# Patient Record
Sex: Male | Born: 1985 | Race: Black or African American | Hispanic: No | Marital: Single | Smoking: Current every day smoker
Health system: Southern US, Community
[De-identification: ages and names within clinical notes are randomized; demographics above are authoritative.]

## PROBLEM LIST (undated history)

## (undated) DIAGNOSIS — Z9109 Other allergy status, other than to drugs and biological substances: Secondary | ICD-10-CM

---
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
## 2006-08-05 ENCOUNTER — Emergency Department (HOSPITAL_COMMUNITY): Admission: EM | Admit: 2006-08-05 | Discharge: 2006-08-05 | Payer: Self-pay | Admitting: Emergency Medicine

## 2006-11-26 ENCOUNTER — Emergency Department (HOSPITAL_COMMUNITY): Admission: EM | Admit: 2006-11-26 | Discharge: 2006-11-26 | Payer: Self-pay | Admitting: Emergency Medicine

## 2006-12-01 ENCOUNTER — Emergency Department (HOSPITAL_COMMUNITY): Admission: EM | Admit: 2006-12-01 | Discharge: 2006-12-01 | Payer: Self-pay | Admitting: Emergency Medicine

## 2009-01-25 ENCOUNTER — Emergency Department (HOSPITAL_COMMUNITY): Admission: EM | Admit: 2009-01-25 | Discharge: 2009-01-25 | Payer: Self-pay | Admitting: Emergency Medicine

## 2009-03-13 ENCOUNTER — Emergency Department (HOSPITAL_COMMUNITY): Admission: EM | Admit: 2009-03-13 | Discharge: 2009-03-13 | Payer: Self-pay | Admitting: Emergency Medicine

## 2009-05-16 ENCOUNTER — Emergency Department (HOSPITAL_COMMUNITY): Admission: EM | Admit: 2009-05-16 | Discharge: 2009-05-16 | Payer: Self-pay | Admitting: Emergency Medicine

## 2013-08-06 ENCOUNTER — Emergency Department (INDEPENDENT_AMBULATORY_CARE_PROVIDER_SITE_OTHER)
Admission: EM | Admit: 2013-08-06 | Discharge: 2013-08-06 | Disposition: A | Discharge status: Home / Self Care | Payer: Self-pay | Source: Home / Self Care | Attending: Family Medicine | Admitting: Family Medicine

## 2013-08-06 ENCOUNTER — Encounter (HOSPITAL_COMMUNITY): Payer: Self-pay | Admitting: *Deleted

## 2013-08-06 DIAGNOSIS — L272 Dermatitis due to ingested food: Secondary | ICD-10-CM

## 2013-08-06 MED ORDER — TRIAMCINOLONE ACETONIDE 40 MG/ML IJ SUSP
INTRAMUSCULAR | Status: AC
  Filled 2013-08-06: qty 1

## 2013-08-06 MED ORDER — METHYLPREDNISOLONE ACETATE 40 MG/ML IJ SUSP
80.0000 mg | Freq: Once | INTRAMUSCULAR | Status: AC
  Administered 2013-08-06: 80 mg via INTRAMUSCULAR

## 2013-08-06 MED ORDER — METHYLPREDNISOLONE ACETATE 80 MG/ML IJ SUSP
INTRAMUSCULAR | Status: AC
  Filled 2013-08-06: qty 1

## 2013-08-06 MED ORDER — TRIAMCINOLONE ACETONIDE 40 MG/ML IJ SUSP
40.0000 mg | Freq: Once | INTRAMUSCULAR | Status: AC
  Administered 2013-08-06: 40 mg via INTRAMUSCULAR

## 2013-08-06 MED ORDER — HYDROXYZINE HCL 25 MG PO TABS: 25.0000 mg | ORAL_TABLET | Freq: Four times a day (QID) | ORAL | Status: DC

## 2013-08-06 NOTE — ED Provider Notes (Signed)
  CSN: [id number]     Arrival date & time 08/06/13  1137 History     First MD Initiated Contact with Patient 08/06/13 1211     Chief Complaint  Patient presents with  . Allergic Reaction   (Consider location/radiation/quality/duration/timing/severity/associated sxs/prior Treatment) Patient is a 27 y.o. male presenting with allergic reaction. The history is provided by the patient.  Allergic Reaction Presenting symptoms: itching and rash   Presenting symptoms: no difficulty breathing, no difficulty swallowing, no swelling and no wheezing   Severity:  Mild Prior allergic episodes:  Food/nut allergies Context: dairy/milk products and food   Context comment:  Onset sev hrs after eating [employer] pizza 2 d ago. Relieved by:  None tried Worsened by:  Nothing tried Ineffective treatments:  None tried   History reviewed. No pertinent past medical history. History reviewed. No pertinent past surgical history. No family history on file. History  Substance Use Topics  . Smoking status: Current Every Day Smoker  . Smokeless tobacco: Not on file  . Alcohol Use: Yes    Review of Systems  Constitutional: Negative.   HENT: Negative for trouble swallowing.   Respiratory: Negative for wheezing.   Cardiovascular: Negative for chest pain.  Skin: Positive for itching and rash.    Allergies  Review of patient's allergies indicates no known allergies.  Home Medications   Current Outpatient Rx  Name  Route  Sig  Dispense  Refill  . hydrOXYzine (ATARAX/VISTARIL) 25 MG tablet   Oral   Take 1 tablet (25 mg total) by mouth every 6 (six) hours. For itching   20 tablet   0    BP 136/89  Pulse 92  Temp(Src) 98.4 F (36.9 C) (Oral)  Resp 20  SpO2 99% Physical Exam  Nursing note and vitals reviewed. Constitutional: He is oriented to person, place, and time. He appears well-developed and well-nourished. No distress.  HENT:  Mouth/Throat: Oropharynx is clear and moist and mucous membranes  are normal. Edematous present.  Neck: Normal range of motion. Neck supple.  Cardiovascular: Normal rate, normal heart sounds and intact distal pulses.   Pulmonary/Chest: Effort normal and breath sounds normal.  Lymphadenopathy:    He has no cervical adenopathy.  Neurological: He is alert and oriented to person, place, and time.  Skin: Skin is warm and dry. Rash noted.  Fine diffuse papular rash over entire body.    ED Course   Procedures (including critical care time)  Labs Reviewed - No data to display No results found. 1. Food allergic skin reaction     MDM    Linna Hoff, MD 08/06/13 1251

## 2013-08-06 NOTE — ED Notes (Signed)
Pt   Reports       Symptoms  Of           Rash  Swelling  As  Well  As   Some tightness  In  Chest   After  Eating  Pizza     A  Few  Days  Ago   He  Is  Sitting  Upright on  Exam table  Speaking in  Complete  sentances  And   Is  In no  Severe  Distress

## 2014-03-17 ENCOUNTER — Encounter (HOSPITAL_COMMUNITY): Payer: Self-pay | Admitting: Emergency Medicine

## 2014-03-17 ENCOUNTER — Emergency Department (HOSPITAL_COMMUNITY)
Admission: EM | Admit: 2014-03-17 | Discharge: 2014-03-17 | Disposition: A | Discharge status: Home / Self Care | Payer: Self-pay | Attending: Emergency Medicine | Admitting: Emergency Medicine

## 2014-03-17 ENCOUNTER — Other Ambulatory Visit: Payer: Self-pay

## 2014-03-17 ENCOUNTER — Emergency Department (HOSPITAL_COMMUNITY): Payer: Self-pay

## 2014-03-17 DIAGNOSIS — R0789 Other chest pain: Secondary | ICD-10-CM | POA: Insufficient documentation

## 2014-03-17 DIAGNOSIS — R630 Anorexia: Secondary | ICD-10-CM | POA: Insufficient documentation

## 2014-03-17 DIAGNOSIS — R000 Tachycardia, unspecified: Secondary | ICD-10-CM | POA: Insufficient documentation

## 2014-03-17 DIAGNOSIS — R1013 Epigastric pain: Secondary | ICD-10-CM | POA: Insufficient documentation

## 2014-03-17 DIAGNOSIS — R079 Chest pain, unspecified: Secondary | ICD-10-CM

## 2014-03-17 DIAGNOSIS — R110 Nausea: Secondary | ICD-10-CM | POA: Insufficient documentation

## 2014-03-17 DIAGNOSIS — F17200 Nicotine dependence, unspecified, uncomplicated: Secondary | ICD-10-CM | POA: Insufficient documentation

## 2014-03-17 DIAGNOSIS — R030 Elevated blood-pressure reading, without diagnosis of hypertension: Secondary | ICD-10-CM | POA: Insufficient documentation

## 2014-03-17 DIAGNOSIS — I10 Essential (primary) hypertension: Secondary | ICD-10-CM

## 2014-03-17 LAB — HEPATIC FUNCTION PANEL
ALBUMIN: 4.2 g/dL (ref 3.5–5.2)
ALT: 27 U/L (ref 0–53)
AST: 31 U/L (ref 0–37)
Alkaline Phosphatase: 55 U/L (ref 39–117)
BILIRUBIN TOTAL: 0.5 mg/dL (ref 0.3–1.2)
Bilirubin, Direct: <0.2 mg/dL (ref 0.0–0.3)
Total Protein: 7.6 g/dL (ref 6.0–8.3)

## 2014-03-17 LAB — LIPASE, BLOOD: LIPASE: 28 U/L (ref 11–59)

## 2014-03-17 LAB — CBC
HCT: 47.4 % (ref 39.0–52.0)
Hemoglobin: 17.5 g/dL — ABNORMAL HIGH (ref 13.0–17.0)
MCH: 32.8 pg (ref 26.0–34.0)
MCHC: 36.9 g/dL — ABNORMAL HIGH (ref 30.0–36.0)
MCV: 88.8 fL (ref 78.0–100.0)
PLATELETS: 267 10*3/uL (ref 150–400)
RBC: 5.34 MIL/uL (ref 4.22–5.81)
RDW: 13.1 % (ref 11.5–15.5)
WBC: 8.1 10*3/uL (ref 4.0–10.5)

## 2014-03-17 LAB — I-STAT TROPONIN, ED
TROPONIN I, POC: 0 ng/mL (ref 0.00–0.08)
TROPONIN I, POC: 0 ng/mL (ref 0.00–0.08)

## 2014-03-17 LAB — BASIC METABOLIC PANEL
BUN: 3 mg/dL — ABNORMAL LOW (ref 6–23)
CHLORIDE: 97 meq/L (ref 96–112)
CO2: 27 mEq/L (ref 19–32)
CREATININE: 0.81 mg/dL (ref 0.50–1.35)
Calcium: 9.4 mg/dL (ref 8.4–10.5)
GFR calc non Af Amer: >90 mL/min (ref >90)
Glucose, Bld: 110 mg/dL — ABNORMAL HIGH (ref 70–99)
POTASSIUM: 3.1 meq/L — AB (ref 3.7–5.3)
Sodium: 142 mEq/L (ref 137–147)

## 2014-03-17 LAB — MAGNESIUM: Magnesium: 1.8 mg/dL (ref 1.5–2.5)

## 2014-03-17 MED ORDER — POTASSIUM CHLORIDE CRYS ER 20 MEQ PO TBCR
40.0000 meq | EXTENDED_RELEASE_TABLET | Freq: Once | ORAL | Status: AC
  Administered 2014-03-17: 40 meq via ORAL
  Filled 2014-03-17: qty 2

## 2014-03-17 MED ORDER — GI COCKTAIL ~~LOC~~
30.0000 mL | Freq: Once | ORAL | Status: AC
  Administered 2014-03-17: 30 mL via ORAL
  Filled 2014-03-17: qty 30

## 2014-03-17 MED ORDER — SODIUM CHLORIDE 0.9 % IV BOLUS (SEPSIS)
1000.0000 mL | Freq: Once | INTRAVENOUS | Status: AC
  Administered 2014-03-17: 1000 mL via INTRAVENOUS

## 2014-03-17 MED ORDER — FAMOTIDINE 20 MG PO TABS
20.0000 mg | ORAL_TABLET | Freq: Once | ORAL | Status: AC
  Administered 2014-03-17: 20 mg via ORAL
  Filled 2014-03-17: qty 1

## 2014-03-17 MED ORDER — HYDROCODONE-ACETAMINOPHEN 5-325 MG PO TABS
2.0000 | ORAL_TABLET | Freq: Once | ORAL | Status: AC
  Administered 2014-03-17: 2 via ORAL
  Filled 2014-03-17: qty 2

## 2014-03-17 NOTE — ED Notes (Signed)
Zavitz MD at bedside. 

## 2014-03-17 NOTE — ED Provider Notes (Signed)
CSN: [id number]     Arrival date & time 03/17/14  0055 History   First MD Initiated Contact with Patient 03/17/14 0107     Chief Complaint  Patient presents with  . Chest Pain     (Consider location/radiation/quality/duration/timing/severity/associated sxs/prior Treatment) HPI Comments: 28 year old male with smoking history, no significant medical history, no family history of cardiac, social alcohol user presents with epigastric pain in the left upper chest pain. Patient has had constant left upper chest pain worse with movement and coughing and breathing for the past 3 days. No history of similar, no injuries. Patient denies recent surgery, blood clot history, leg pain/swelling, long travel, active cancer. No cardiac history known. Pain is constant ache.  No diaphoresis or exertional symptoms. Epigastric pain has been similar time., Mild worsening with palpation in evening. No known gallbladder problems. No blood in the stools, NSAID use or known ulcer/scope history  Patient is a 28 y.o. male presenting with chest pain. The history is provided by the patient.  Chest Pain Associated symptoms: nausea   Associated symptoms: no abdominal pain, no back pain, no cough, no fever, no headache, no shortness of breath and not vomiting     History reviewed. No pertinent past medical history. History reviewed. No pertinent past surgical history. History reviewed. No pertinent family history. History  Substance Use Topics  . Smoking status: Current Every Day Smoker  . Smokeless tobacco: Not on file  . Alcohol Use: Yes    Review of Systems  Constitutional: Positive for appetite change. Negative for fever and chills.  HENT: Negative for congestion.   Eyes: Negative for visual disturbance.  Respiratory: Negative for cough and shortness of breath.   Cardiovascular: Positive for chest pain.  Gastrointestinal: Positive for nausea. Negative for vomiting and abdominal pain.  Genitourinary: Negative  for dysuria and flank pain.  Musculoskeletal: Negative for back pain, neck pain and neck stiffness.  Skin: Negative for rash.  Neurological: Negative for light-headedness and headaches.      Allergies  Review of patient's allergies indicates no known allergies.  Home Medications  No current outpatient prescriptions on file. BP 162/120  Pulse 110  Temp(Src) 98.9 F (37.2 C) (Oral)  Resp 24  Wt 195 lb (88.451 kg)  SpO2 99% Physical Exam  Nursing note and vitals reviewed. Constitutional: He is oriented to person, place, and time. He appears well-developed and well-nourished.  HENT:  Head: Normocephalic and atraumatic.  Eyes: Conjunctivae are normal. Right eye exhibits no discharge. Left eye exhibits no discharge.  Neck: Normal range of motion. Neck supple. No tracheal deviation present.  Cardiovascular: Regular rhythm.  Tachycardia present.   Pulmonary/Chest: Effort normal and breath sounds normal.  Abdominal: Soft. He exhibits no distension. There is tenderness (mild tenderness). There is no guarding.  Musculoskeletal: He exhibits tenderness (moderate tender left upper rib). He exhibits no edema.  Neurological: He is alert and oriented to person, place, and time.  Skin: Skin is warm. No rash noted.  Psychiatric: He has a normal mood and affect.    ED Course  Procedures (including critical care time) Labs Review Labs Reviewed  CBC - Abnormal; Notable for the following:    Hemoglobin 17.5 (*)    MCHC 36.9 (*)    All other components within normal limits  BASIC METABOLIC PANEL - Abnormal; Notable for the following:    Potassium 3.1 (*)    Glucose, Bld 110 (*)    BUN 3 (*)    All other components within normal  limits  HEPATIC FUNCTION PANEL  LIPASE, BLOOD  MAGNESIUM  I-STAT TROPOININ, ED  I-STAT TROPOININ, ED   Imaging Review Dg Chest 2 View  03/17/2014   CLINICAL DATA:  7 hr of mid chest pain.  EXAM: CHEST  2 VIEW  COMPARISON:  DG CHEST 1 VIEW dated 06/24/2005   FINDINGS: Cardiomediastinal silhouette is unremarkable. The lungs are clear without pleural effusions or focal consolidations. Trachea projects midline and there is no pneumothorax. Soft tissue planes and included osseous structures are non-suspicious.  IMPRESSION: No acute cardiopulmonary process.   Electronically Signed   By: Awilda Metroourtnay  Bloomer   On: 03/17/2014 01:30     EKG Interpretation   Date/Time:  Monday March 17 2014 00:59:35 EDT Ventricular Rate:  104 PR Interval:  152 QRS Duration: 90 QT Interval:  350 QTC Calculation: 460 R Axis:   39 Text Interpretation:  Sinus tachycardia Nonspecific ST and T wave  abnormality Abnormal ECG Confirmed by Vanessa Kampf  MD, Jamaris Theard ([id number]) on  03/17/2014 1:48:01 AM      MDM   Final diagnoses:  Chest pain  Epigastric abdominal pain  High blood pressure   Atypical chest pain with only smoking as risk factor. Pain is reproducible and with concurrent epigastric pain concern more likely for musculoskeletal/gastric ulcer/GERD/other. Plans screening cardiac workup, GI cocktail, pain medicines. Chest x-ray ordered EKG reviewed no acute findings chest x-ray reviewed no acute findings  Patient symptoms improve significantly GI cocktail. Vitals improved in the ER. Strongly suggested patient follow close with primary car care doctor for blood pressure monitoring and if chest pain recurs for stress test. Felt to troponin negative and ED. No concern for PE at this time.  Results and differential diagnosis were discussed with the patient. Close follow up outpatient was discussed, patient comfortable with the plan.   Filed Vitals:   03/17/14 0300 03/17/14 0315 03/17/14 0330 03/17/14 0445  BP: 153/108 150/105 152/99 151/98  Pulse:  122 79 67  Temp:      TempSrc:      Resp:      Weight:      SpO2:  91% 99% 98%       Enid SkeensJoshua M Ayslin Kundert, MD 03/17/14 [phone]

## 2014-03-17 NOTE — Discharge Instructions (Signed)
Take prilosec for 2 weeks. Obtain a primary doctor. See your doctor for blood pressure recheck and adjustments to decrease risk of stroke or heart attack in the future. If you were given medicines take as directed.  If you are on coumadin or contraceptives realize their levels and effectiveness is altered by many different medicines.  If you have any reaction (rash, tongues swelling, other) to the medicines stop taking and see a physician.   Please follow up as directed and return to the ER or see a physician for new or worsening symptoms.  Thank you. Filed Vitals:   03/17/14 0245 03/17/14 0300 03/17/14 0315 03/17/14 0330  BP: 151/102 153/108 150/105 152/99  Pulse:   122 79  Temp:      TempSrc:      Resp:      Weight:      SpO2:   91% 99%    Emergency Department Resource Guide 1) Find a Doctor and Pay Out of Pocket Although you won't have to find out who is covered by your insurance plan, it is a good idea to ask around and get recommendations. You will then need to call the office and see if the doctor you have chosen will accept you as a new patient and what types of options they offer for patients who are self-pay. Some doctors offer discounts or will set up payment plans for their patients who do not have insurance, but you will need to ask so you aren't surprised when you get to your appointment.  2) Contact Your Local Health Department Not all health departments have doctors that can see patients for sick visits, but many do, so it is worth a call to see if yours does. If you don't know where your local health department is, you can check in your phone book. The CDC also has a tool to help you locate your state's health department, and many state websites also have listings of all of their local health departments.  3) Find a Walk-in Clinic If your illness is not likely to be very severe or complicated, you may want to try a walk in clinic. These are popping up all over the country in  pharmacies, drugstores, and shopping centers. They're usually staffed by nurse practitioners or physician assistants that have been trained to treat common illnesses and complaints. They're usually fairly quick and inexpensive. However, if you have serious medical issues or chronic medical problems, these are probably not your best option.  No Primary Care Doctor: - Call Health Connect at  [phone] - they can help you locate a primary care doctor that  accepts your insurance, provides certain services, etc. - Physician Referral Service- [phone]  Chronic Pain Problems: Organization         Address  Phone   Notes  [facility]  [phone] Patients need to be referred by their primary care doctor.   Medication Assistance: Organization         Address  Phone   Notes  [facility] Medication [facility] [address] [place] [place], [place] [address] [phone] --Must be a resident of [facility] -- Must have NO insurance coverage whatsoever (no Medicaid/ Medicare, etc.) -- The pt. MUST have a primary care doctor that directs their care regularly and follows them in the community   Med[facility]ist  [phone]   [employer]  [phone]    Agencies that provide inexpensive medical care: Organization  Address  Phone   Notes  [facility]  [phone]   [facility]    [phone]   [facility] [address] [place], [place] [address] [phone]   [facility] [place] [address] [place] [place] [phone]   Planned Parenthood    [phone]   [facility]    [phone]   [facility]  [address], [place] Phone:  [phone], Fax:  [phone] Hours of Operation:  9 am - 6 pm, M-F.  Also accepts Medicaid/Medicare and self-pay.  [facility]  [address],  [place] Phone: [phone], Fax: [phone]. Hours of Operation:  8:30 am - 5:30 pm, M-F.  Also accepts Medicaid and self-pay.  [facility] [place] [address], [place] Phone: [phone]   [facility] [address] Natasha Bence [place], [place] [phone], Ext. [phone] Mondays & Thursdays: 7-9 AM.  First 15 patients are seen on a first come, first serve basis.    Medicaid-accepting [facility] Providers:  Organization         Address  Phone   Notes  [facility] [address], [place] [phone] Also accepts self-pay patients.  [facility] [address] Laurell Josephs [place], [place]  [phone]   [facility] [address], [place] [phone]   [facility] [address] [place] [phone]   Renaye Rakers [address], [place]   [phone] Only accepts [place] Access [place] patients after they have their name applied to their card.   Self-Pay (no insurance) in [facility]:  Organization         Address  Phone   Notes  Sickle Cell Patients, [facility] [address] [place], [place] [phone]   [facility] [address] [place], [place] [phone]   [facility] [place]  [address], [place] [phone]   [facility]/Dr. Osei-Bonsu  [address] [place] Sanchez or [address], [place] [phone] Phone number for both [place] and [place] locations is the same.  [facility] [address] [place] [phone]   [facility] [address], [place] or [address] [phone]   [facility] [address] [place] [phone], phone; [phone], fax Sees patients 1st and 3rd Saturday of every month.  Must not  qualify for public or private insurance (i.e. Medicaid, Medicare, [place] Health Choice, Veterans' Benefits)  Household income should be no more than 200% of the poverty level The clinic cannot treat you if you are pregnant or think you are pregnant  Sexually transmitted diseases are not treated at the clinic.    Dental Care: Organization         Address  Phone  Notes  [facility] Department of [facility] [address] [place], [place] [phone] Accepts children up to age 41 who are enrolled in [place] or [place] Health Choice; pregnant women with a Medicaid card; and children who have applied for Medicaid or [place] Health Choice, but were declined, whose parents can pay a reduced fee at time  of service.  [facility] [place]  [address], [place] [phone] Accepts children up to age 27 who are enrolled in [place] or [place] Health Choice; pregnant women with a Medicaid card; and children who have applied for Medicaid or  Health Choice, but were declined, whose parents can pay a reduced fee at time of service.  [facility]  [address], [place] [phone] Patients are seen by appointment only. Walk-ins are not accepted. [facility] will see patients 27 years of age and older. Monday - Tuesday (8am-5pm) Most Wednesdays (8:30-5pm) $30 per visit, cash only  [facility]  [address], [facility] [phone] Patients are seen by appointment only. Walk-ins are not accepted. [facility] will see patients 27 years of age and older. One Wednesday Evening (Monthly: Volunteer Based).  $30 per visit, cash only  [employer]  [phone] for adults; Children under age 84, call Graduate Pediatric Dentistry at [phone]. Children aged 224-14, please call [phone] to request a pediatric application.  Dental services are provided  in all areas of dental care including fillings, crowns and bridges, complete and partial dentures, implants, gum treatment, root canals, and extractions. Preventive care is also provided. Treatment is provided to both adults and children. Patients are selected via a lottery and there is often a waiting list.   [facility] [address], [place]  [phone] [email or web address]   [facility] [address], [place], [place] [phone], Ext. [phone] Second and Fourth Thursday of each month, opens at 6:30 AM; Clinic ends at 9 AM.  Patients are seen on a first-come first-served basis, and a limited number are seen during each clinic.   [facility]  [address], [place], [place] [phone]   Eligibility Requirements You must have lived in [place], [place], or [place] counties for at least the last three months.   You cannot be eligible for state or federal sponsored [place] Cityhealthcare insurance, including CIGNAVeterans Administration, [place]icaid, or [employer]icare.   You generally cannot be eligible for healthcare insurance through your employer.    How to apply: Eligibility screenings are held every Tuesday and Wednesday afternoon from 1:00 pm until 4:00 pm. You do not need an appointment for the interview!  [facility] [address], [place], [place] [phone]   [facility]  [phone]   [facility]  [phone]   [facility]  [phone]    Behavioral Health Resources in the Community: Intensive Outpatient Programs Organization         Address  Phone  Notes  [facility] [address], [place], [place] [phone]   [facility] [address], [place], [place] [phone]   [facility] [address], [place], [place]  [phone]   [facility] [address],  [place], [place]  [phone] or [phone]   Substance Abuse Resources Organization         Address  Phone  Notes  [facility]  [phone]   [facility]  [phone]   [facility]  [phone]   [facility]  [phone]   Residential & Outpatient Substance Abuse Program  [phone]   Psychological Services Organization         Address  Phone  Notes  [facility]  [place]  [phone]   [place] [address] [place] or [phone]    Mobile Crisis Teams Organization         Address  Phone  Notes  [facility], Mobile Crisis Care Unit  [phone]   [facility]  [address] [place], [place]   [place] [address], [place] [phone]    Self-Help/Support Groups Organization         Address  Phone             Notes  [place] of [place] - variety of support groups  [place] Call for more information  Narcotics Anonymous (NA), [facility] [address], [employer] [place]  2 meetings at this location   Special educational needs teacher         Address  Phone  Notes  ASAP Residential Treatment [place],    [place]  [phone]   [facility]  [address] [place] [phone], [place], [place]   [place], [place] [phone] Admissions: 8am-3pm M-F  Incentives Substance [place] [address]    [place], [place] [phone]   [facility] [address] [place], [place], [place]   [facility] [address],  [place], [place]   [facility] - Intensive Outpatient [address] Kristeen [address], [place], [place]   [facility] ([place] [address]  [place], [place] [phone] or  [phone]   [facility] ([facility]) [address], [place], [place] Accepts Medicaid  [facility] [place] [address]  [place] [phone] Substance Abuse/Addiction Treatment   [facility] Organization         Address  Phone  Notes  [facility]  [phone]   Domenic Schwab, PhD [address] Arlis [place], [place]   [phone] or [phone]   [facility] [place] [address] [place], [place] [phone]   [facility] [address] [place], [place] [phone] Insurance/Medicaid/sponsorship through [facility] and Families [address], [address] [place]                                    [place], [place] [phone] [facility] [address], [place] [phone]    Dr. Adele Schilder  [phone]   [facility] Dept. 1) [address] [place] 2) [place] 3)  [address], [place] [phone]  [phone]   [place] [phone] or [phone] (After Hours)

## 2014-03-17 NOTE — ED Notes (Addendum)
Present with left sided chest pain began at 11 pm this evening while driving pain is described as tender,  Nothing makes pain better, deep breathing and touch makes pain worse. Pain does not radiate. Pain is associated with nausea. Denies dizziness and lightheadedness. BP 162/120 -HR 110

## 2016-04-19 ENCOUNTER — Emergency Department (HOSPITAL_COMMUNITY)
Admission: EM | Admit: 2016-04-19 | Discharge: 2016-04-19 | Disposition: A | Discharge status: Home / Self Care | Payer: Self-pay | Attending: Emergency Medicine | Admitting: Emergency Medicine

## 2016-04-19 ENCOUNTER — Encounter (HOSPITAL_COMMUNITY): Payer: Self-pay | Admitting: *Deleted

## 2016-04-19 ENCOUNTER — Emergency Department (HOSPITAL_COMMUNITY): Payer: Self-pay

## 2016-04-19 DIAGNOSIS — J069 Acute upper respiratory infection, unspecified: Secondary | ICD-10-CM | POA: Insufficient documentation

## 2016-04-19 DIAGNOSIS — Z7951 Long term (current) use of inhaled steroids: Secondary | ICD-10-CM | POA: Insufficient documentation

## 2016-04-19 DIAGNOSIS — Z792 Long term (current) use of antibiotics: Secondary | ICD-10-CM | POA: Insufficient documentation

## 2016-04-19 DIAGNOSIS — Z87891 Personal history of nicotine dependence: Secondary | ICD-10-CM | POA: Insufficient documentation

## 2016-04-19 DIAGNOSIS — Z79899 Other long term (current) drug therapy: Secondary | ICD-10-CM | POA: Insufficient documentation

## 2016-04-19 HISTORY — DX: Other allergy status, other than to drugs and biological substances: Z91.09

## 2016-04-19 MED ORDER — ALBUTEROL SULFATE (2.5 MG/3ML) 0.083% IN NEBU
2.5000 mg | INHALATION_SOLUTION | Freq: Once | RESPIRATORY_TRACT | Status: AC
  Administered 2016-04-19: 2.5 mg via RESPIRATORY_TRACT
  Filled 2016-04-19: qty 3

## 2016-04-19 MED ORDER — ALBUTEROL SULFATE HFA 108 (90 BASE) MCG/ACT IN AERS
1.0000 | INHALATION_SPRAY | RESPIRATORY_TRACT | Status: DC | PRN
Start: 2016-04-19 — End: 2016-04-19
  Administered 2016-04-19: 2 via RESPIRATORY_TRACT
  Filled 2016-04-19: qty 6.7

## 2016-04-19 MED ORDER — AZITHROMYCIN 250 MG PO TABS: 250.0000 mg | ORAL_TABLET | Freq: Every day | ORAL | Status: AC

## 2016-04-19 MED ORDER — ALBUTEROL SULFATE HFA 108 (90 BASE) MCG/ACT IN AERS: 1.0000 | INHALATION_SPRAY | Freq: Four times a day (QID) | RESPIRATORY_TRACT | Status: AC | PRN

## 2016-04-19 NOTE — ED Provider Notes (Signed)
CSN: [id number]     Arrival date & time 04/19/16  0535 History   First MD Initiated Contact with Patient 04/19/16 0630     Chief Complaint  Patient presents with  . URI   HPI  Larry Warren is a 30 year old male presenting with nasal congestion. Onset of symptoms was approximately 2 weeks ago. He reports severe congestion to the point where he can no longer breathing through his nose. He endorses yellow nasal discharge. Denies sinus pressure or headaches. He states that he woke in the middle of the night because he could not breathe out of his nose. He then developed the sensation of chest tightness and chest congestion. He attempted to take a hot shower to relieve his congestion but this did not improve the symptoms. He has a history of seasonal allergies for which he takes Zyrtec and Claritin. He has not tried any other over-the-counter medications. He states that he does not take these daily but only as needed. Denies history of asthma or other pulmonary disease. Denies known sick contacts. Denies fevers, chills, headaches, ear pain, eye discharge, sore throat, difficulty swallowing, cough, chest pain, wheezing, shortness of breath, abdominal pain, nausea or vomiting.  Past Medical History  Diagnosis Date  . Environmental allergies    History reviewed. No pertinent past surgical history. No family history on file. Social History  Substance Use Topics  . Smoking status: Former Games developer  . Smokeless tobacco: None  . Alcohol Use: Yes     Comment: socially    Review of Systems  All other systems reviewed and are negative.     Allergies  Review of patient's allergies indicates no known allergies.  Home Medications   Prior to Admission medications   Medication Sig Start Date End Date Taking? Authorizing Provider  cetirizine (ZYRTEC) 10 MG tablet Take 10 mg by mouth daily as needed for allergies.   Yes Historical Provider, MD  albuterol (PROVENTIL HFA;VENTOLIN HFA) 108 (90 Base) MCG/ACT  inhaler Inhale 1-2 puffs into the lungs every 6 (six) hours as needed for wheezing or shortness of breath. 04/19/16   Knute Mazzuca, PA-C  azithromycin (ZITHROMAX) 250 MG tablet Take 1 tablet (250 mg total) by mouth daily. Take first 2 tablets together, then 1 every day until finished. 04/19/16   Tonatiuh Mallon, PA-C   BP 143/101 mmHg  Pulse 100  Temp(Src) 99.3 F (37.4 C) (Oral)  Resp 17  Ht  (1.803 m)  Wt 98.884 kg  BMI 30.42 kg/m2  SpO2 100% Physical Exam  Constitutional: He appears well-developed and well-nourished. No distress.  Nontoxic-appearing  HENT:  Head: Normocephalic and atraumatic.  Nose: Mucosal edema and rhinorrhea present. Right sinus exhibits no maxillary sinus tenderness and no frontal sinus tenderness. Left sinus exhibits no maxillary sinus tenderness and no frontal sinus tenderness.  Mouth/Throat: Uvula is midline and oropharynx is clear and moist. No oropharyngeal exudate.  Eyes: Conjunctivae are normal. Right eye exhibits no discharge. Left eye exhibits no discharge. No scleral icterus.  Neck: Normal range of motion.  Cardiovascular: Normal rate, regular rhythm and normal heart sounds.   Pulmonary/Chest: Effort normal and breath sounds normal. No respiratory distress. He has no wheezes. He has no rales.  Breathing unlabored  Musculoskeletal: Normal range of motion.  Neurological: He is alert. Coordination normal.  Skin: Skin is warm and dry.  Psychiatric: He has a normal mood and affect. His behavior is normal.  Nursing note and vitals reviewed.   ED Course  Procedures (including critical  care time) Labs Review Labs Reviewed - No data to display  Imaging Review Dg Chest 2 View  04/19/2016  CLINICAL DATA:  Cough and congestion for 2 weeks with shortness of breath EXAM: CHEST  2 VIEW COMPARISON:  March 17, 2014 FINDINGS: Lungs are clear. Heart size and pulmonary vascularity are normal. No adenopathy. No pneumothorax. No bone lesions. IMPRESSION: No edema or  consolidation. Electronically Signed   By: Bretta BangWilliam  Woodruff III M.D.   On: 04/19/2016 07:42   I have personally reviewed and evaluated these images and lab results as part of my medical decision-making.   EKG Interpretation None      MDM   Final diagnoses:  URI (upper respiratory infection)   30 year old male presenting with nasal and chest congestion 2 weeks. Afebrile and nontoxic appearing. Hypertensive which chart review shows is patient's baseline. Patient admits that he is been told that he has high blood pressure in the past but he has not sought primary care for this. Nasal congestion and rhinorrhea noted. No sinus tenderness to palpation. Oropharynx without erythema or exudate. Lungs clear to auscultation bilaterally without adventitious breath sounds. Chest x-ray without acute abnormality. Given breathing treatment in emergency department which patient states improved his chest congestion. Given the length of symptoms and patient request, will discharge with azithromycin though I believe this is related to his allergies. Will discharge with inhaler for symptom relief. Encouraged patient to continue his daily seasonal allergy medications and use over-the-counter decongestant such as Mucinex. Given referral information for PCP and shortly encouraged patient to schedule follow-up appointment given long-standing hypertension. Return precautions given in discharge paperwork and discussed with pt at bedside. Pt stable for discharge     Alveta HeimlichStevi Dominick Zertuche, PA-C 04/19/16 1428  Lorre NickAnthony Allen, MD 04/22/16 1404

## 2016-04-19 NOTE — Discharge Instructions (Signed)
Upper Respiratory Infection, Adult Most upper respiratory infections (URIs) are a viral infection of the air passages leading to the lungs. A URI affects the nose, throat, and upper air passages. The most common type of URI is nasopharyngitis and is typically referred to as "the common cold." URIs run their course and usually go away on their own. Most of the time, a URI does not require medical attention, but sometimes a bacterial infection in the upper airways can follow a viral infection. This is called a secondary infection. Sinus and middle ear infections are common types of secondary upper respiratory infections. Bacterial pneumonia can also complicate a URI. A URI can worsen asthma and chronic obstructive pulmonary disease (COPD). Sometimes, these complications can require emergency medical care and may be life threatening.  CAUSES Almost all URIs are caused by viruses. A virus is a type of germ and can spread from one person to another.  RISKS FACTORS You may be at risk for a URI if:   You smoke.   You have chronic heart or lung disease.  You have a weakened defense (immune) system.   You are very young or very old.   You have nasal allergies or asthma.  You work in crowded or poorly ventilated areas.  You work in health care facilities or schools. SIGNS AND SYMPTOMS  Symptoms typically develop 2-3 days after you come in contact with a cold virus. Most viral URIs last 7-10 days. However, viral URIs from the influenza virus (flu virus) can last 14-18 days and are typically more severe. Symptoms may include:   Runny or stuffy (congested) nose.   Sneezing.   Cough.   Sore throat.   Headache.   Fatigue.   Fever.   Loss of appetite.   Pain in your forehead, behind your eyes, and over your cheekbones (sinus pain).  Muscle aches.  DIAGNOSIS  Your health care provider may diagnose a URI by:  Physical exam.  Tests to check that your symptoms are not due to  another condition such as:  Strep throat.  Sinusitis.  Pneumonia.  Asthma. TREATMENT  A URI goes away on its own with time. It cannot be cured with medicines, but medicines may be prescribed or recommended to relieve symptoms. Medicines may help:  Reduce your fever.  Reduce your cough.  Relieve nasal congestion. HOME CARE INSTRUCTIONS   Take medicines only as directed by your health care provider.   Gargle warm saltwater or take cough drops to comfort your throat as directed by your health care provider.  Use a warm mist humidifier or inhale steam from a shower to increase air moisture. This may make it easier to breathe.  Drink enough fluid to keep your urine clear or pale yellow.   Eat soups and other clear broths and maintain good nutrition.   Rest as needed.   Return to work when your temperature has returned to normal or as your health care provider advises. You may need to stay home longer to avoid infecting others. You can also use a face mask and careful hand washing to prevent spread of the virus.  Increase the usage of your inhaler if you have asthma.   Do not use any tobacco products, including cigarettes, chewing tobacco, or electronic cigarettes. If you need help quitting, ask your health care provider. PREVENTION  The best way to protect yourself from getting a cold is to practice good hygiene.   Avoid oral or hand contact with people with cold   symptoms.   Wash your hands often if contact occurs.  There is no clear evidence that vitamin C, vitamin E, echinacea, or exercise reduces the chance of developing a cold. However, it is always recommended to get plenty of rest, exercise, and practice good nutrition.  SEEK MEDICAL CARE IF:   You are getting worse rather than better.   Your symptoms are not controlled by medicine.   You have chills.  You have worsening shortness of breath.  You have brown or red mucus.  You have yellow or brown nasal  discharge.  You have pain in your face, especially when you bend forward.  You have a fever.  You have swollen neck glands.  You have pain while swallowing.  You have white areas in the back of your throat. SEEK IMMEDIATE MEDICAL CARE IF:   You have severe or persistent:  Headache.  Ear pain.  Sinus pain.  Chest pain.  You have chronic lung disease and any of the following:  Wheezing.  Prolonged cough.  Coughing up blood.  A change in your usual mucus.  You have a stiff neck.  You have changes in your:  Vision.  Hearing.  Thinking.  Mood. MAKE SURE YOU:   Understand these instructions.  Will watch your condition.  Will get help right away if you are not doing well or get worse.   This information is not intended to replace advice given to you by your health care provider. Make sure you discuss any questions you have with your health care provider.   Document Released: 05/31/2001 Document Revised: 04/21/2015 Document Reviewed: 03/12/2014 Elsevier Interactive Patient Education 2016 [employer]  

## 2016-04-19 NOTE — ED Notes (Signed)
Pt states that he has been congested and coughing over the last 2 weeks; pt states that he woke up approx an hour ago and just felt like he could not get air in; pt states that he has allergies and has been congested but states that this morning it got worse; pt c/o chest feeling tight; pt states that he attempted to take a shower with no relief; no accessory muscle and no obvious distress noted; pt able to speak in full and complete sentences

## 2016-04-19 NOTE — ED Notes (Signed)
Patient transported to X-ray 

## 2019-05-28 ENCOUNTER — Emergency Department (HOSPITAL_COMMUNITY): Payer: No Typology Code available for payment source

## 2019-05-28 ENCOUNTER — Emergency Department (HOSPITAL_COMMUNITY)
Admission: EM | Admit: 2019-05-28 | Discharge: 2019-05-28 | Disposition: A | Discharge status: Home / Self Care | Payer: No Typology Code available for payment source | Attending: Emergency Medicine | Admitting: Emergency Medicine

## 2019-05-28 ENCOUNTER — Encounter (HOSPITAL_COMMUNITY): Payer: Self-pay | Admitting: Emergency Medicine

## 2019-05-28 ENCOUNTER — Other Ambulatory Visit: Payer: Self-pay

## 2019-05-28 DIAGNOSIS — S50312A Abrasion of left elbow, initial encounter: Secondary | ICD-10-CM | POA: Insufficient documentation

## 2019-05-28 DIAGNOSIS — Y999 Unspecified external cause status: Secondary | ICD-10-CM | POA: Diagnosis not present

## 2019-05-28 DIAGNOSIS — M25572 Pain in left ankle and joints of left foot: Secondary | ICD-10-CM | POA: Insufficient documentation

## 2019-05-28 DIAGNOSIS — F17200 Nicotine dependence, unspecified, uncomplicated: Secondary | ICD-10-CM | POA: Insufficient documentation

## 2019-05-28 DIAGNOSIS — R109 Unspecified abdominal pain: Secondary | ICD-10-CM | POA: Diagnosis not present

## 2019-05-28 DIAGNOSIS — Y939 Activity, unspecified: Secondary | ICD-10-CM | POA: Insufficient documentation

## 2019-05-28 DIAGNOSIS — Z23 Encounter for immunization: Secondary | ICD-10-CM | POA: Diagnosis not present

## 2019-05-28 DIAGNOSIS — S62015A Nondisplaced fracture of distal pole of navicular [scaphoid] bone of left wrist, initial encounter for closed fracture: Secondary | ICD-10-CM | POA: Diagnosis not present

## 2019-05-28 DIAGNOSIS — R51 Headache: Secondary | ICD-10-CM | POA: Insufficient documentation

## 2019-05-28 DIAGNOSIS — S6992XA Unspecified injury of left wrist, hand and finger(s), initial encounter: Secondary | ICD-10-CM | POA: Diagnosis present

## 2019-05-28 DIAGNOSIS — Y92410 Unspecified street and highway as the place of occurrence of the external cause: Secondary | ICD-10-CM | POA: Diagnosis not present

## 2019-05-28 DIAGNOSIS — M25552 Pain in left hip: Secondary | ICD-10-CM | POA: Insufficient documentation

## 2019-05-28 DIAGNOSIS — M7918 Myalgia, other site: Secondary | ICD-10-CM

## 2019-05-28 LAB — URINALYSIS, ROUTINE W REFLEX MICROSCOPIC
Bilirubin Urine: NEGATIVE
Glucose, UA: NEGATIVE mg/dL
Ketones, ur: NEGATIVE mg/dL
Leukocytes,Ua: NEGATIVE
Nitrite: NEGATIVE
Protein, ur: NEGATIVE mg/dL
Specific Gravity, Urine: >1.046 — ABNORMAL HIGH (ref 1.005–1.030)
pH: 6 (ref 5.0–8.0)

## 2019-05-28 LAB — COMPREHENSIVE METABOLIC PANEL
ALT: 70 U/L — ABNORMAL HIGH (ref 0–44)
AST: 62 U/L — ABNORMAL HIGH (ref 15–41)
Albumin: 4.4 g/dL (ref 3.5–5.0)
Alkaline Phosphatase: 65 U/L (ref 38–126)
Anion gap: 12 (ref 5–15)
BUN: 7 mg/dL (ref 6–20)
CO2: 24 mmol/L (ref 22–32)
Calcium: 9.5 mg/dL (ref 8.9–10.3)
Chloride: 104 mmol/L (ref 98–111)
Creatinine, Ser: 0.85 mg/dL (ref 0.61–1.24)
GFR calc Af Amer: >60 mL/min (ref >60)
GFR calc non Af Amer: >60 mL/min (ref >60)
Glucose, Bld: 101 mg/dL — ABNORMAL HIGH (ref 70–99)
Potassium: 4.8 mmol/L (ref 3.5–5.1)
Sodium: 140 mmol/L (ref 135–145)
Total Bilirubin: 1.5 mg/dL — ABNORMAL HIGH (ref 0.3–1.2)
Total Protein: 7.9 g/dL (ref 6.5–8.1)

## 2019-05-28 LAB — CBC WITH DIFFERENTIAL/PLATELET
Abs Immature Granulocytes: 0.03 10*3/uL (ref 0.00–0.07)
Basophils Absolute: 0.1 10*3/uL (ref 0.0–0.1)
Basophils Relative: 1 %
Eosinophils Absolute: 0.2 10*3/uL (ref 0.0–0.5)
Eosinophils Relative: 2 %
HCT: 44.1 % (ref 39.0–52.0)
Hemoglobin: 15.1 g/dL (ref 13.0–17.0)
Immature Granulocytes: 0 %
Lymphocytes Relative: 28 %
Lymphs Abs: 2.8 10*3/uL (ref 0.7–4.0)
MCH: 30.4 pg (ref 26.0–34.0)
MCHC: 34.2 g/dL (ref 30.0–36.0)
MCV: 88.7 fL (ref 80.0–100.0)
Monocytes Absolute: 0.8 10*3/uL (ref 0.1–1.0)
Monocytes Relative: 8 %
Neutro Abs: 6.3 10*3/uL (ref 1.7–7.7)
Neutrophils Relative %: 61 %
Platelets: 335 10*3/uL (ref 150–400)
RBC: 4.97 MIL/uL (ref 4.22–5.81)
RDW: 13.3 % (ref 11.5–15.5)
WBC: 10.2 10*3/uL (ref 4.0–10.5)
nRBC: 0 % (ref 0.0–0.2)

## 2019-05-28 LAB — LIPASE, BLOOD: Lipase: 32 U/L (ref 11–51)

## 2019-05-28 MED ORDER — OXYCODONE-ACETAMINOPHEN 5-325 MG PO TABS: 1.0000 | ORAL_TABLET | Freq: Four times a day (QID) | ORAL | 0 refills | Status: AC | PRN

## 2019-05-28 MED ORDER — MORPHINE SULFATE (PF) 4 MG/ML IV SOLN
4.0000 mg | Freq: Once | INTRAVENOUS | Status: AC
  Administered 2019-05-28: 4 mg via INTRAVENOUS
  Filled 2019-05-28: qty 1

## 2019-05-28 MED ORDER — TETANUS-DIPHTH-ACELL PERTUSSIS 5-2.5-18.5 LF-MCG/0.5 IM SUSP
0.5000 mL | Freq: Once | INTRAMUSCULAR | Status: AC
  Administered 2019-05-28: 0.5 mL via INTRAMUSCULAR
  Filled 2019-05-28: qty 0.5

## 2019-05-28 MED ORDER — OXYCODONE-ACETAMINOPHEN 5-325 MG PO TABS
1.0000 | ORAL_TABLET | Freq: Once | ORAL | Status: AC
  Administered 2019-05-28: 1 via ORAL
  Filled 2019-05-28: qty 1

## 2019-05-28 MED ORDER — IOHEXOL 300 MG/ML  SOLN
100.0000 mL | Freq: Once | INTRAMUSCULAR | Status: AC | PRN
  Administered 2019-05-28: 100 mL via INTRAVENOUS

## 2019-05-28 NOTE — Discharge Instructions (Signed)
Please read attached information. If you experience any new or worsening signs or symptoms please return to the emergency room for evaluation. Please follow-up with your primary care provider or specialist as discussed. Please use medication prescribed only as directed and discontinue taking if you have any concerning signs or symptoms.   °

## 2019-05-28 NOTE — Progress Notes (Signed)
Orthopedic Tech Progress Note Patient Details:  Larry Warren 1986/05/21 [id number]  Ortho Devices Type of Ortho Device: Thumb velcro splint Ortho Device/Splint Location: ULE Ortho Device/Splint Interventions: Adjustment, Application, Ordered   Post Interventions Patient Tolerated: Well Instructions Provided: Adjustment of device, Care of device   Janit Pagan 05/28/2019, 6:50 PM
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
## 2019-05-28 NOTE — ED Notes (Signed)
ED Provider at bedside. 

## 2019-05-28 NOTE — ED Notes (Signed)
Ortho at bedside.

## 2019-05-28 NOTE — ED Notes (Signed)
Ortho tech paged  

## 2019-05-28 NOTE — ED Notes (Signed)
Patient verbalizes understanding of discharge instructions. Opportunity for questioning and answers were provided. Pt discharged from ED. 

## 2019-05-28 NOTE — ED Notes (Signed)
Patient transported to CT 
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
